# Patient Record
Sex: Male | Born: 2000 | Race: Black or African American | Hispanic: No | Marital: Single | Smoking: Never smoker
Health system: Southern US, Community
[De-identification: ages and names within clinical notes are randomized; demographics above are authoritative.]

## PROBLEM LIST (undated history)

## (undated) DIAGNOSIS — J45909 Unspecified asthma, uncomplicated: Secondary | ICD-10-CM

---
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
## 2022-01-18 IMAGING — RF DG ANKLE COMPLETE 3+V*L*
1 series · 4 of 4 positions shown · non-contrast
Comparison: December 28, 2014

FLUOROSCOPY TIME:  0 minutes 18 seconds; 0.8811 mGy; 2 acquired
images

CLINICAL DATA: Open reduction internal fixation for lateral
malleolar fracture and deltoid ligament injury

EXAM:
LEFT ANKLE [id number] VIEW

[Series 1: unknown protocol · 0.14mm/px · 4 of 4 slices shown]
[im 1/4]
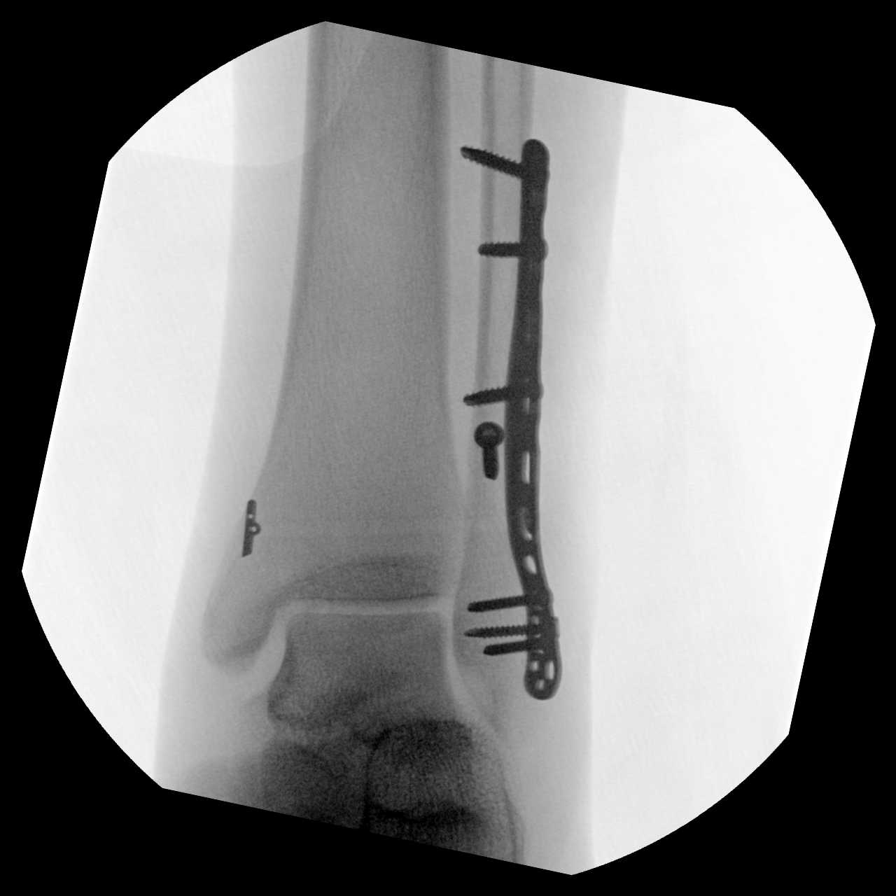
[im 2/4]
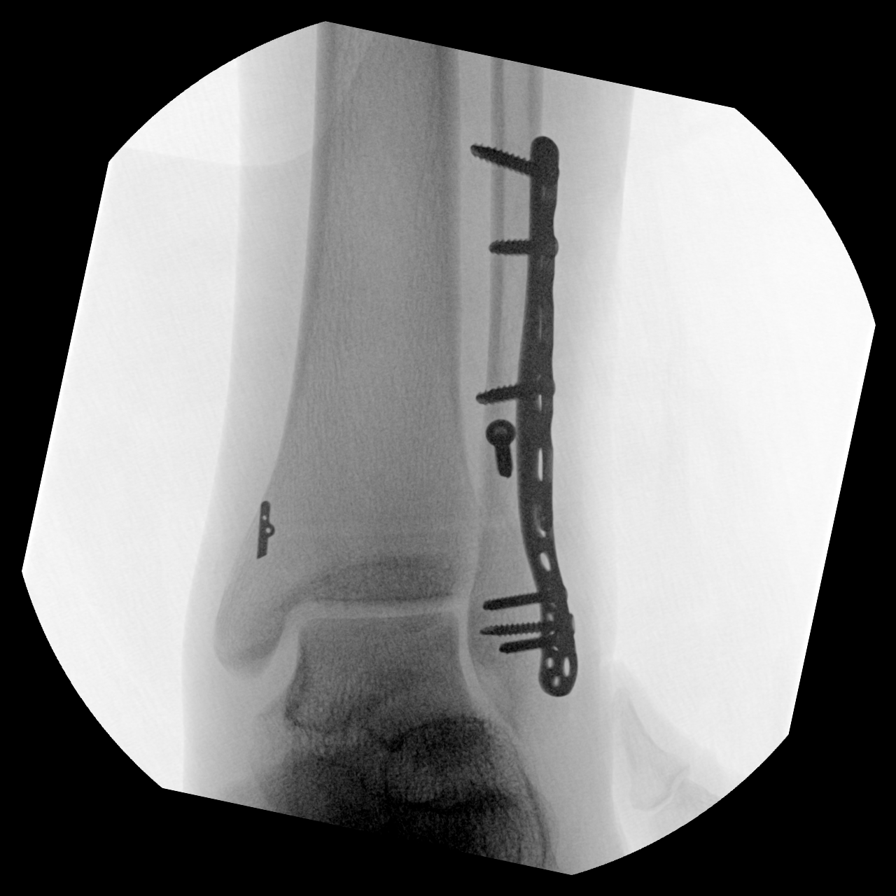
[im 3/4]
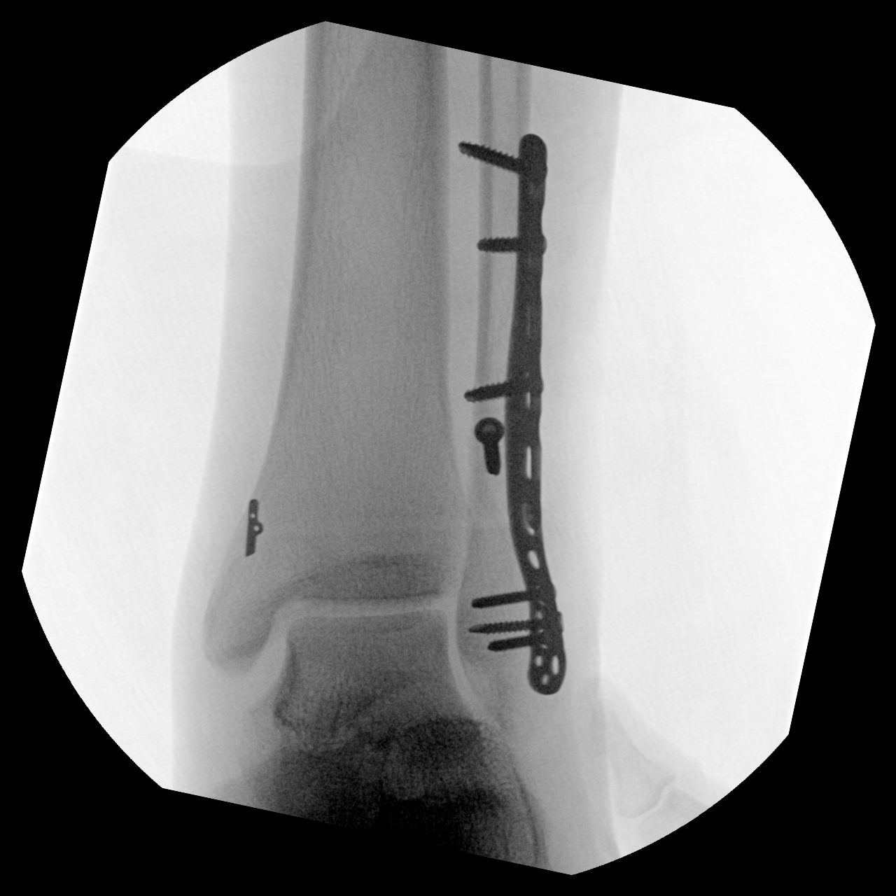
[im 4/4]
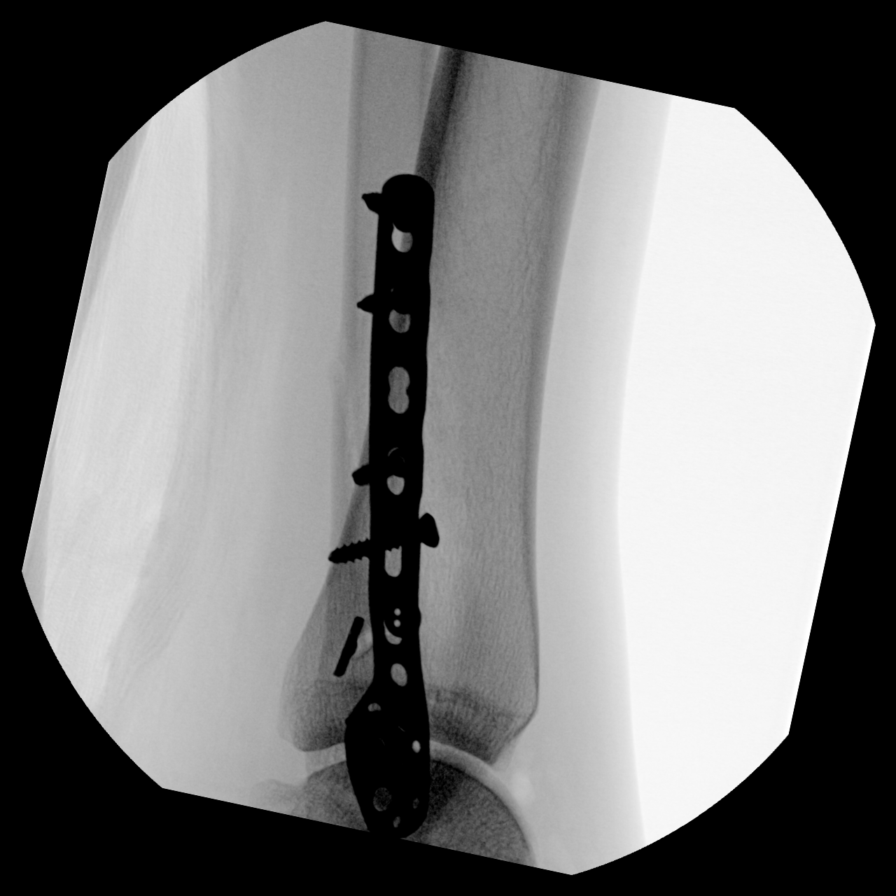

[4 of 4 positions shown; findings below may reference images not displayed]

FINDINGS: Frontal and lateral views show screw and plate fixation in the
distal fibular region. A small plate is noted along the medial
distal tibia. Alignment in these areas is anatomic. No new fracture.
Ankle mortise appears intact. No appreciable joint space narrowing.
IMPRESSION: Postoperative changes as noted with alignment anatomic in
postoperative areas. No new fracture. Ankle mortise appears intact.
No appreciable arthropathic change.

## 2022-05-12 ENCOUNTER — Emergency Department (HOSPITAL_COMMUNITY): Payer: 59

## 2022-05-12 ENCOUNTER — Emergency Department (HOSPITAL_COMMUNITY)
Admission: EM | Admit: 2022-05-12 | Discharge: 2022-05-12 | Disposition: A | Discharge status: Home / Self Care | Payer: 59 | Attending: Emergency Medicine | Admitting: Emergency Medicine

## 2022-05-12 ENCOUNTER — Other Ambulatory Visit: Payer: Self-pay

## 2022-05-12 DIAGNOSIS — R109 Unspecified abdominal pain: Secondary | ICD-10-CM | POA: Diagnosis not present

## 2022-05-12 DIAGNOSIS — R112 Nausea with vomiting, unspecified: Secondary | ICD-10-CM | POA: Insufficient documentation

## 2022-05-12 LAB — LIPASE, BLOOD: Lipase: 31 U/L (ref 11–51)

## 2022-05-12 LAB — CBC WITH DIFFERENTIAL/PLATELET
Abs Immature Granulocytes: 0.01 10*3/uL (ref 0.00–0.07)
Basophils Absolute: 0 10*3/uL (ref 0.0–0.1)
Basophils Relative: 0 %
Eosinophils Absolute: 0 10*3/uL (ref 0.0–0.5)
Eosinophils Relative: 0 %
HCT: 44.9 % (ref 39.0–52.0)
Hemoglobin: 14.2 g/dL (ref 13.0–17.0)
Immature Granulocytes: 0 %
Lymphocytes Relative: 8 %
Lymphs Abs: 0.5 10*3/uL — ABNORMAL LOW (ref 0.7–4.0)
MCH: 26.8 pg (ref 26.0–34.0)
MCHC: 31.6 g/dL (ref 30.0–36.0)
MCV: 84.7 fL (ref 80.0–100.0)
Monocytes Absolute: 0.5 10*3/uL (ref 0.1–1.0)
Monocytes Relative: 8 %
Neutro Abs: 4.8 10*3/uL (ref 1.7–7.7)
Neutrophils Relative %: 84 %
Platelets: 179 10*3/uL (ref 150–400)
RBC: 5.3 MIL/uL (ref 4.22–5.81)
RDW: 14.3 % (ref 11.5–15.5)
WBC: 5.8 10*3/uL (ref 4.0–10.5)
nRBC: 0 % (ref 0.0–0.2)

## 2022-05-12 LAB — COMPREHENSIVE METABOLIC PANEL
ALT: 39 U/L (ref 0–44)
AST: 99 U/L — ABNORMAL HIGH (ref 15–41)
Albumin: 4 g/dL (ref 3.5–5.0)
Alkaline Phosphatase: 45 U/L (ref 38–126)
Anion gap: 9 (ref 5–15)
BUN: 8 mg/dL (ref 6–20)
CO2: 26 mmol/L (ref 22–32)
Calcium: 8.9 mg/dL (ref 8.9–10.3)
Chloride: 105 mmol/L (ref 98–111)
Creatinine, Ser: 1.22 mg/dL (ref 0.61–1.24)
GFR, Estimated: >60 mL/min (ref >60)
Glucose, Bld: 97 mg/dL (ref 70–99)
Potassium: 4.1 mmol/L (ref 3.5–5.1)
Sodium: 140 mmol/L (ref 135–145)
Total Bilirubin: 0.8 mg/dL (ref 0.3–1.2)
Total Protein: 7 g/dL (ref 6.5–8.1)

## 2022-05-12 MED ORDER — ONDANSETRON 4 MG PO TBDP: 4.0000 mg | ORAL_TABLET | Freq: Three times a day (TID) | ORAL | 0 refills | Status: DC | PRN

## 2022-05-12 MED ORDER — SODIUM CHLORIDE 0.9 % IV BOLUS
1000.0000 mL | Freq: Once | INTRAVENOUS | Status: AC
  Administered 2022-05-12: 1000 mL via INTRAVENOUS

## 2022-05-12 MED ORDER — ONDANSETRON 4 MG PO TBDP: 4.0000 mg | ORAL_TABLET | Freq: Three times a day (TID) | ORAL | 0 refills | Status: AC | PRN

## 2022-05-12 MED ORDER — DICYCLOMINE HCL 20 MG PO TABS: 20.0000 mg | ORAL_TABLET | Freq: Two times a day (BID) | ORAL | 0 refills | Status: DC

## 2022-05-12 MED ORDER — METOCLOPRAMIDE HCL 5 MG/ML IJ SOLN
10.0000 mg | INTRAMUSCULAR | Status: AC
  Administered 2022-05-12: 10 mg via INTRAVENOUS
  Filled 2022-05-12: qty 2

## 2022-05-12 MED ORDER — KETOROLAC TROMETHAMINE 15 MG/ML IJ SOLN
15.0000 mg | Freq: Once | INTRAMUSCULAR | Status: AC
  Administered 2022-05-12: 15 mg via INTRAVENOUS
  Filled 2022-05-12: qty 1

## 2022-05-12 MED ORDER — DICYCLOMINE HCL 20 MG PO TABS: 20.0000 mg | ORAL_TABLET | Freq: Two times a day (BID) | ORAL | 0 refills | Status: AC

## 2022-05-12 MED ORDER — ONDANSETRON HCL 4 MG/2ML IJ SOLN
4.0000 mg | Freq: Once | INTRAMUSCULAR | Status: AC
  Administered 2022-05-12: 4 mg via INTRAVENOUS
  Filled 2022-05-12: qty 2

## 2022-05-12 MED ORDER — FAMOTIDINE IN NACL 20-0.9 MG/50ML-% IV SOLN
20.0000 mg | Freq: Once | INTRAVENOUS | Status: AC
  Administered 2022-05-12: 20 mg via INTRAVENOUS
  Filled 2022-05-12: qty 50

## 2022-05-12 MED ORDER — PANTOPRAZOLE SODIUM 40 MG IV SOLR
40.0000 mg | Freq: Once | INTRAVENOUS | Status: AC
  Administered 2022-05-12: 40 mg via INTRAVENOUS
  Filled 2022-05-12: qty 10

## 2022-05-12 MED ORDER — IOHEXOL 350 MG/ML SOLN
75.0000 mL | Freq: Once | INTRAVENOUS | Status: AC | PRN
  Administered 2022-05-12: 75 mL via INTRAVENOUS

## 2022-05-12 NOTE — ED Triage Notes (Signed)
Patient arrived with EMS from home reports intermittent RLQ abdominal pain with emesis onset 1 am this morning , no diarrhea or fever. He received Zofran 4 mg IV by EMS with relief.

## 2022-05-12 NOTE — ED Provider Notes (Signed)
[facility] EMERGENCY DEPARTMENT AT [facility] Provider Note   CSN: [id number] Arrival date & time:        History  Chief Complaint  Patient presents with   Abdominal Pain    Ronald Christian is a 22 y.o. male.  22 year old male presents to the emergency department for evaluation of nausea and vomiting.  Reports that he went to [employer] at 9 PM before returning home and going to bed.  He awoke at 1 AM with sudden onset of vomiting and has had numerous episodes of emesis.  Approximates 10 episodes of nonbloody, nonbilious emesis.  Received 4 mg Zofran by EMS en route to the ED.  He reports that this improved his nausea.  Patient endorsing some associated left-sided abdominal cramping.  No history of prior abdominal surgeries.  Denies fever, diarrhea, urinary symptoms.  The history is provided by the patient and the EMS personnel. No language interpreter was used.  Abdominal Pain      Home Medications Prior to Admission medications   Medication Sig Start Date End Date Taking? Authorizing Provider  cetirizine (ZYRTEC) 10 MG tablet Take 10 mg by mouth daily as needed. 05/03/19   [provider]  Cetirizine HCl (ZYRTEC ALLERGY PO) Take 5 mLs by mouth daily as needed. For allergies    [provider]  ibuprofen (ADVIL) 200 MG tablet Take 400 mg by mouth every 4 (four) hours as needed for mild pain or moderate pain.    [provider]  Triamcinolone Acetonide (TRIAMCINOLONE 0.1 % CREAM : EUCERIN) CREA Apply 1 application topically 2 (two) times daily.    [provider]      Allergies    Patient has no known allergies.    Review of Systems   Review of Systems  Gastrointestinal:  Positive for abdominal pain.  Ten systems reviewed and are negative for acute change, except as noted in the HPI.    Physical Exam Updated Vital Signs BP 114/66   Pulse 61   Temp 98.1 F (36.7 C)   Resp 16   SpO2 100%  Physical Exam Vitals and  nursing note reviewed.  Constitutional:      General: He is not in acute distress.    Appearance: He is well-developed. He is not diaphoretic.     Comments: Intermittently overcome by waves of nausea; seemingly uncomfortable.  Nontoxic.  HENT:     Head: Normocephalic and atraumatic.  Eyes:     General: No scleral icterus.    Conjunctiva/sclera: Conjunctivae normal.  Cardiovascular:     Rate and Rhythm: Normal rate and regular rhythm.     Pulses: Normal pulses.  Pulmonary:     Effort: Pulmonary effort is normal. No respiratory distress.     Comments: Respirations even and unlabored Abdominal:     Palpations: Abdomen is soft.     Comments: Abdomen is soft, nondistended. No focal TTP. No palpable masses or peritoneal signs.  Musculoskeletal:        General: Normal range of motion.     Cervical back: Normal range of motion.  Skin:    General: Skin is warm and dry.     Coloration: Skin is not pale.     Findings: No erythema or rash.  Neurological:     Mental Status: He is alert and oriented to person, place, and time.     Coordination: Coordination normal.  Psychiatric:        Behavior: Behavior normal.  ED Results / Procedures / Treatments   Labs (all labs ordered are listed, but only abnormal results are displayed) Labs Reviewed  CBC WITH DIFFERENTIAL/PLATELET - Abnormal; Notable for the following components:      Result Value   Lymphs Abs 0.5 (*)    All other components within normal limits  COMPREHENSIVE METABOLIC PANEL - Abnormal; Notable for the following components:   AST 99 (*)    All other components within normal limits  LIPASE, BLOOD    EKG None  Radiology No results found.  Procedures Procedures    Medications Ordered in ED Medications  pantoprazole (PROTONIX) injection 40 mg (has no administration in time range)  sodium chloride 0.9 % bolus 1,000 mL (1,000 mLs Intravenous New Bag/Given 05/12/22 0544)  famotidine (PEPCID) IVPB 20 mg premix (0 mg  Intravenous Stopped 05/12/22 0612)  metoCLOPramide (REGLAN) injection 10 mg (10 mg Intravenous Given 05/12/22 0546)  ketorolac (TORADOL) 15 MG/ML injection 15 mg (15 mg Intravenous Given 05/12/22 0545)    ED Course/ Medical Decision Making/ A&P Clinical Course as of 05/12/22 0646  Sun May 12, 2022  0645 Overall feeling better, but one episode of emesis about 5 minutes ago. Declines additional medications at this time. Will necessitate PO trial. [KH]    Clinical Course User Index [KH] Antony Madura, PA-C                             Medical Decision Making Amount and/or Complexity of Data Reviewed Labs: ordered.  Risk Prescription drug management.   This patient presents to the ED for concern of nausea and vomiting, this involves an extensive number of treatment options, and is a complaint that carries with it a high risk of complications and morbidity.  The differential diagnosis includes viral illness vs food borne illness vs PUD/gastritis vs biliary pathology vs pSBO/SBO   Co morbidities that complicate the patient evaluation  None    Additional history obtained:  Additional history obtained from EMS   Lab Tests:  I Ordered, and personally interpreted labs.  The pertinent results include:  AST 99. Otherwise normal CBC, CMP, lipase.   Cardiac Monitoring:  The patient was maintained on a cardiac monitor.  I personally viewed and interpreted the cardiac monitored which showed an underlying rhythm of: NSR   Medicines ordered and prescription drug management:  I ordered medication including Toradol for pain and Pepcid, Protonix, Reglan for vomiting  Reevaluation of the patient after these medicines showed that the patient improved I have reviewed the patients home medicines and have made adjustments as needed   Test Considered:  UA - however, denies urinary symptoms. Normal kidney function.   Problem List / ED Course:  Patient with onset of nausea and vomiting  after eating [employer] tonight.  High suspicion for foodborne illness.  His symptoms have been better controlled with PPI as well as antiemetics.  Given IV fluids in the ED. He is afebrile without concern for SIRS/sepsis.  No leukocytosis or left shift.  No electrolyte derangements.  Liver and kidney function largely reassuring. No focal tenderness in the right upper quadrant.  Negative Murphy sign.  Given preserved LFTs, low suspicion for biliary pathology.  No tenderness at McBurney's point to suggest appendicitis.  Presentation of symptoms is also atypical for this. Anticipate discharge with prescription for antiemetics if able to tolerate oral fluid challenge.   Reevaluation:  After the interventions noted above, I reevaluated the patient  and found that they have :improved   Social Determinants of Health:  Good social support; girlfriend at bedside.   Dispostion:  Care signed out to [place], [place] at shift change pending PO trial.         Final Clinical Impression(s) / ED Diagnoses Final diagnoses:  Nausea and vomiting, unspecified vomiting type    Rx / DC Orders ED Discharge Orders     None         Antony Madura, PA-C 05/12/22 1610    Sabas Sous, MD 05/12/22 [phone]

## 2022-05-12 NOTE — ED Provider Notes (Signed)
Signout received on this 22 year old male who presented with abdominal pain, nausea and vomiting started last night.  This started couple hours after he had eaten at [employer].  At signout he is awaiting p.o. challenge. Physical Exam  BP 102/70   Pulse 65   Temp 97.8 F (36.6 C) (Oral)   Resp 16   SpO2 100%     Procedures  Procedures  ED Course / MDM   Clinical Course as of 05/12/22 0902  Sun May 12, 2022  0645 Overall feeling better, but one episode of emesis about 5 minutes ago. Declines additional medications at this time. Will necessitate PO trial. [KH]    Clinical Course User Index [KH] Antony Madura, PA-C   Medical Decision Making Amount and/or Complexity of Data Reviewed Labs: ordered. Radiology: ordered.  Risk Prescription drug management.   Patient failed p.o. challenge.  Zofran given.  Discussion had with the patient.  He opts to have CT abdomen pelvis performed.  Will obtain this. No acute intra-abdominal finding on CT scan.  Does show evidence of gastroenteritis.  Patient still not able to tolerate much of any p.o. fluids.  Shared decision making had regarding admission versus discharge.  He would like to discharge with symptomatic management and return for any concerning or worsening symptoms.       Marita Kansas, PA-C 05/12/22 1610    Wynetta Fines, MD 05/12/22 [phone]

## 2022-05-12 NOTE — Discharge Instructions (Signed)
Your workup today was reassuring.  CT scan showed evidence of gastroenteritis.  Blood work overall reassuring.  Have sent a couple medications into the pharmacy for you.  Please use these to manage the pain, and nausea and vomiting.  For any persistent or worsening symptoms with the inability to tolerate oral intake please return to the emergency room.

## 2022-05-12 NOTE — ED Notes (Signed)
DC instructions reviewed pt. PT verbalized understanding. PT DC

## 2022-09-11 NOTE — Progress Notes (Deleted)
[facility] [address] [place] [address] Dept: [phone] Dept Fax: [phone]  New Patient Office Visit  Subjective:   Ronald Christian Jul 12, 2000 09/12/2022  No chief complaint on file.   HPI: Ronald Christian presents today to establish care at [facility]. Introduced to Publishing rights manager role and practice setting.  All questions answered.  Concerns: See below   STD SCREENING: Ronald Christian presents for STD screening.   Sexual activity:  {Blank single:19197::"Not sexually active","In a Monogamous Relationship","Practices careful partner selection, always uses condoms","Recent unprotected sexual encounter"} Contraception: {Blank single:19197::"yes","no"} Recent unprotected intercourse: {Blank single:19197::"yes","no"} Recent known exposure to STD's: {Blank single:19197::"yes","no"} History of sexually transmitted diseases: {Blank single:19197::"yes","no"}  Genital lesions: {Blank single:19197::"yes","no"} Genital discharge: {Blank single:19197::"yes","no"} Dysuria: {Blank single:19197::"yes","no"} Swollen lymph nodes: {Blank single:19197::"yes","no"} Fevers: {Blank single:19197::"yes","no"} Rash: {Blank single:19197::"yes","no"}    The following portions of the patient's history were reviewed and updated as appropriate: past medical history, past surgical history, family history, social history, allergies, medications, and problem list.   There are no problems to display for this patient.  No past medical history on file. Past Surgical History:  Procedure Laterality Date   OSTEOCHONDROMA EXCISION Left 08/17/2019   Procedure: LEFT ANKLE ARTHROSCOPIC DEBRIDEMENT EXTENSIVE, OPEN TREATMENT OF LATERAL MALLEOLUS FRACTURE, DELTOID LIGAMENT RECONSTRUCTION, OPEN TREATMENT OF SYNDESMOSIS;  Surgeon: Terance Hart, MD;  Location: [facility];  Service: Orthopedics;  Laterality: Left;   LENGTH OF SURGERY: 2.5 HOURS   No family history on file.  Current Outpatient Medications:    cetirizine (ZYRTEC) 10 MG tablet, Take 10 mg by mouth daily as needed., Disp: , Rfl:    Cetirizine HCl (ZYRTEC ALLERGY PO), Take 5 mLs by mouth daily as needed. For allergies, Disp: , Rfl:    dicyclomine (BENTYL) 20 MG tablet, Take 1 tablet (20 mg total) by mouth 2 (two) times daily., Disp: 20 tablet, Rfl: 0   ibuprofen (ADVIL) 200 MG tablet, Take 400 mg by mouth every 4 (four) hours as needed for mild pain or moderate pain., Disp: , Rfl:    ondansetron (ZOFRAN-ODT) 4 MG disintegrating tablet, Take 1 tablet (4 mg total) by mouth every 8 (eight) hours as needed for nausea or vomiting., Disp: 20 tablet, Rfl: 0   Triamcinolone Acetonide (TRIAMCINOLONE 0.1 % CREAM : EUCERIN) CREA, Apply 1 application topically 2 (two) times daily., Disp: , Rfl:  No Known Allergies  ROS: A complete ROS was performed with pertinent positives/negatives noted in the HPI. The remainder of the ROS are negative.   Objective:   There were no vitals filed for this visit.  GENERAL: Well-appearing, in NAD. Well nourished.  SKIN: Pink, warm and dry. No rash, lesion, ulceration, or ecchymoses.  NECK: Trachea midline. Full ROM w/o pain or tenderness. No lymphadenopathy.  RESPIRATORY: Chest wall symmetrical. Respirations even and non-labored. Breath sounds clear to auscultation bilaterally.  CARDIAC: S1, S2 present, regular rate and rhythm. Peripheral pulses 2+ bilaterally.  MSK: Muscle tone and strength appropriate for age. Joints w/o tenderness, redness, or swelling.  EXTREMITIES: Without clubbing, cyanosis, or edema.  NEUROLOGIC: No motor or sensory deficits. Steady, even gait.  PSYCH/MENTAL STATUS: Alert, oriented x 3. Cooperative, appropriate mood and affect.   Health Maintenance Due  Topic Date Due   HPV VACCINES (1 - Male 3-dose series) Never done   HIV Screening  Never done   Hepatitis C Screening  Never done    DTaP/Tdap/Td (1 - Tdap) Never done   COVID-19  Vaccine (1 - 2023-24 season) Never done   INFLUENZA VACCINE  08/22/2022    No results found for any visits on 09/12/22.  Assessment & Plan:        There are no diagnoses linked to this encounter.   No follow-ups on file.   Salvatore Decent, FNP

## 2022-09-12 ENCOUNTER — Ambulatory Visit: Payer: 59 | Admitting: Internal Medicine

## 2022-09-12 ENCOUNTER — Telehealth: Payer: Self-pay | Admitting: Pediatrics

## 2022-09-12 NOTE — Telephone Encounter (Signed)
Pt was a no show for a NP appt with Salvatore Decent on 09/12/22, I dismissed and  did not send a letter.
